# Patient Record
Sex: Female | Born: 2000 | Race: White | Hispanic: Yes | Marital: Single | Smoking: Never smoker
Health system: Southern US, Community
[De-identification: ages and names within clinical notes are randomized; demographics above are authoritative.]

## PROBLEM LIST (undated history)

## (undated) DIAGNOSIS — R7309 Other abnormal glucose: Secondary | ICD-10-CM

## (undated) HISTORY — DX: Other abnormal glucose: R73.09

## (undated) HISTORY — PX: WISDOM TOOTH EXTRACTION: SHX21

## (undated) HISTORY — PX: TONSILLECTOMY: SUR1361

---
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
## 2021-03-14 ENCOUNTER — Other Ambulatory Visit: Payer: Self-pay

## 2021-03-14 ENCOUNTER — Encounter: Payer: Self-pay | Admitting: Nurse Practitioner

## 2021-03-14 ENCOUNTER — Ambulatory Visit (INDEPENDENT_AMBULATORY_CARE_PROVIDER_SITE_OTHER): Payer: Medicaid Other | Admitting: Nurse Practitioner

## 2021-03-14 VITALS — BP 122/78 | HR 65 | Temp 98.1°F | Ht 62.0 in | Wt 173.8 lb

## 2021-03-14 DIAGNOSIS — K5900 Constipation, unspecified: Secondary | ICD-10-CM | POA: Insufficient documentation

## 2021-03-14 DIAGNOSIS — Z23 Encounter for immunization: Secondary | ICD-10-CM

## 2021-03-14 DIAGNOSIS — Z7689 Persons encountering health services in other specified circumstances: Secondary | ICD-10-CM

## 2021-03-14 NOTE — Assessment & Plan Note (Signed)
Chronic, acutely worsened.  Discussed dietary habits - try to increase intake of foods high in fiber like whole grains, fruits, vegetables.  Can try OTC Metamucil.  Continue with plenty of hydration with water.  With no improvement, return  to clinic.

## 2021-03-14 NOTE — Progress Notes (Signed)
Subjective:    Patient ID: Shelley Weber, female    DOB: 06/18/2001, 19 y.o.   MRN: [id number]  HPI: Rosalind Guido is a 20 y.o. female presenting for new patient visit to establish care.  Introduced to Publishing rights manager role and practice setting.  All questions answered.  Discussed provider/patient relationship and expectations.  Chief Complaint  Patient presents with   Establish Care    Constipated for a while, taking miralax, last bowel movement yesterday   CONSTIPATION Duration: 2 months  Goes to the gym almost every day - drinks 1 gallon of water daily Pain: yes Severity: moderate to severe Aggravating factors: nothing Alleviating factors: nothing History of similar: yes Blood in stool: no Treatments attempted: Miralax in the past, stopped taking it 1 year ago Diet: eats vegetables, very little fried, food Thinks she is lactose intolerant  Breakfast: usually oatmeal, sometimes skips breakfast Lunch: beans, rice, tortillas Dinner: beans, rice, tortillas  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  Is into lifting and getting into power lifting.  Goes to gym almost every day.   MENSES Contraception: condoms Previous contraception: condoms Sexual activity: currently sexually active with 1 female partner, monogamous Gravida/Para: G0P0 Menarche at age: 56 Average interval between menses: 28 days Length of menses: 4 days Flow: moderate, does not have to double up on pads/tampons Dysmenorrhea: no  Allergies  Allergen Reactions   Ibuprofen     Mother reports that pt gets chest pain after taking    Outpatient Encounter Medications as of 03/14/2021  Medication Sig   [DISCONTINUED] dexamethasone (DECADRON) 4 MG tablet Take 1 tablet (4 mg total) by mouth 2 (two) times daily with a meal.   [DISCONTINUED] triamcinolone cream (KENALOG) 0.1 % Apply 1 application topically 2 (two) times daily.   No facility-administered  encounter medications on file as of 03/14/2021.    Active Ambulatory Problems    Diagnosis Date Noted   Acanthosis nigricans 03/05/2016   Obesity 03/05/2016   Constipation 03/14/2021   Resolved Ambulatory Problems    Diagnosis Date Noted   No Resolved Ambulatory Problems   Past Medical History:  Diagnosis Date   Elevated hemoglobin A1c 12/2014    Past Medical History:  Diagnosis Date   Elevated hemoglobin A1c 12/2014   A1c 6%    Past Surgical History:  Procedure Laterality Date   WISDOM TOOTH EXTRACTION      Social History   Tobacco Use   Smoking status: Never   Smokeless tobacco: Never  Vaping Use   Vaping Use: Never used  Substance Use Topics   Alcohol use: No   Drug use: No    Family History  Problem Relation Age of Onset   Hyperlipidemia Mother    Cancer Father    Lung cancer Father    Asthma Brother    Heart disease Maternal Grandfather     Review of Systems Per HPI unless specifically indicated above     Objective:    BP 122/78   Pulse 65   Temp 98.1 F (36.7 C)   Ht 5\' 2"  (1.575 m)   Wt 173 lb 12.8 oz (78.8 kg)   LMP 03/05/2021   SpO2 98%   BMI 31.79 kg/m   Wt Readings from Last 3 Encounters:  03/14/21 173 lb 12.8 oz (78.8 kg)  04/25/18 163 lb (73.9 kg) (91 %, Z= 1.36)*  01/10/18 170 lb (77.1 kg) (94 %, Z= 1.53)*   *  Growth percentiles are based on CDC (Girls, 2-20 Years) data.    Physical Exam Vitals and nursing note reviewed.  Constitutional:      General: She is not in acute distress.    Appearance: Normal appearance. She is not toxic-appearing.  HENT:     Head: Normocephalic and atraumatic.     Nose: Nose normal. No congestion.     Mouth/Throat:     Mouth: Mucous membranes are moist.     Pharynx: Oropharynx is clear.  Eyes:     General: No scleral icterus.    Extraocular Movements: Extraocular movements intact.  Cardiovascular:     Rate and Rhythm: Normal rate and regular rhythm.     Heart sounds: Normal heart sounds. No  murmur heard. Pulmonary:     Effort: Pulmonary effort is normal. No respiratory distress.     Breath sounds: No wheezing, rhonchi or rales.  Abdominal:     General: Abdomen is flat. Bowel sounds are normal. There is no distension.     Palpations: Abdomen is soft. There is no mass.     Tenderness: There is no abdominal tenderness. There is no right CVA tenderness or left CVA tenderness.  Musculoskeletal:        General: Normal range of motion.     Cervical back: Normal range of motion.     Right lower leg: No edema.     Left lower leg: No edema.  Lymphadenopathy:     Cervical: No cervical adenopathy.  Skin:    General: Skin is warm and dry.     Capillary Refill: Capillary refill takes less than 2 seconds.     Coloration: Skin is not jaundiced or pale.     Findings: No erythema or rash.  Neurological:     Mental Status: She is alert and oriented to person, place, and time.     Motor: No weakness.     Gait: Gait normal.  Psychiatric:        Mood and Affect: Mood normal.        Behavior: Behavior normal.        Thought Content: Thought content normal.        Judgment: Judgment normal.      Assessment & Plan:   Problem List Items Addressed This Visit       Other   Constipation    Chronic, acutely worsened.  Discussed dietary habits - try to increase intake of foods high in fiber like whole grains, fruits, vegetables.  Can try OTC Metamucil.  Continue with plenty of hydration with water.  With no improvement, return  to clinic.       Other Visit Diagnoses     Encounter to establish care    -  Primary   Relevant Orders   Meningococcal B, Recombinant(Trumenba) (Completed)   Need for vaccination       Relevant Orders   Meningococcal MCV4O(Menveo) (Completed)        Follow up plan: Return if symptoms worsen or fail to improve.

## 2021-04-04 ENCOUNTER — Encounter: Payer: Self-pay | Admitting: Nurse Practitioner
